# Patient Record
Sex: Male | Born: 1980 | Race: White | Hispanic: No | Marital: Married
Health system: Southern US, Community
[De-identification: ages and names within clinical notes are randomized; demographics above are authoritative.]

---

## 2013-12-02 ENCOUNTER — Emergency Department: Payer: Self-pay | Admitting: Emergency Medicine

## 2013-12-02 LAB — RAPID INFLUENZA A&B ANTIGENS

## 2014-08-25 ENCOUNTER — Ambulatory Visit: Payer: Self-pay | Admitting: Surgery

## 2014-08-27 LAB — PATHOLOGY REPORT

## 2015-01-27 IMAGING — CR DG CHEST 2V
1 series · 2 of 2 positions shown · non-contrast
Comparison: None.

CLINICAL DATA: 32-year-old male cough and fever. Initial encounter.

EXAM:
CHEST  2 VIEW

[Series 1: w chest pa · 0.14mm/px · 2 of 2 slices shown]
[im 1/2]
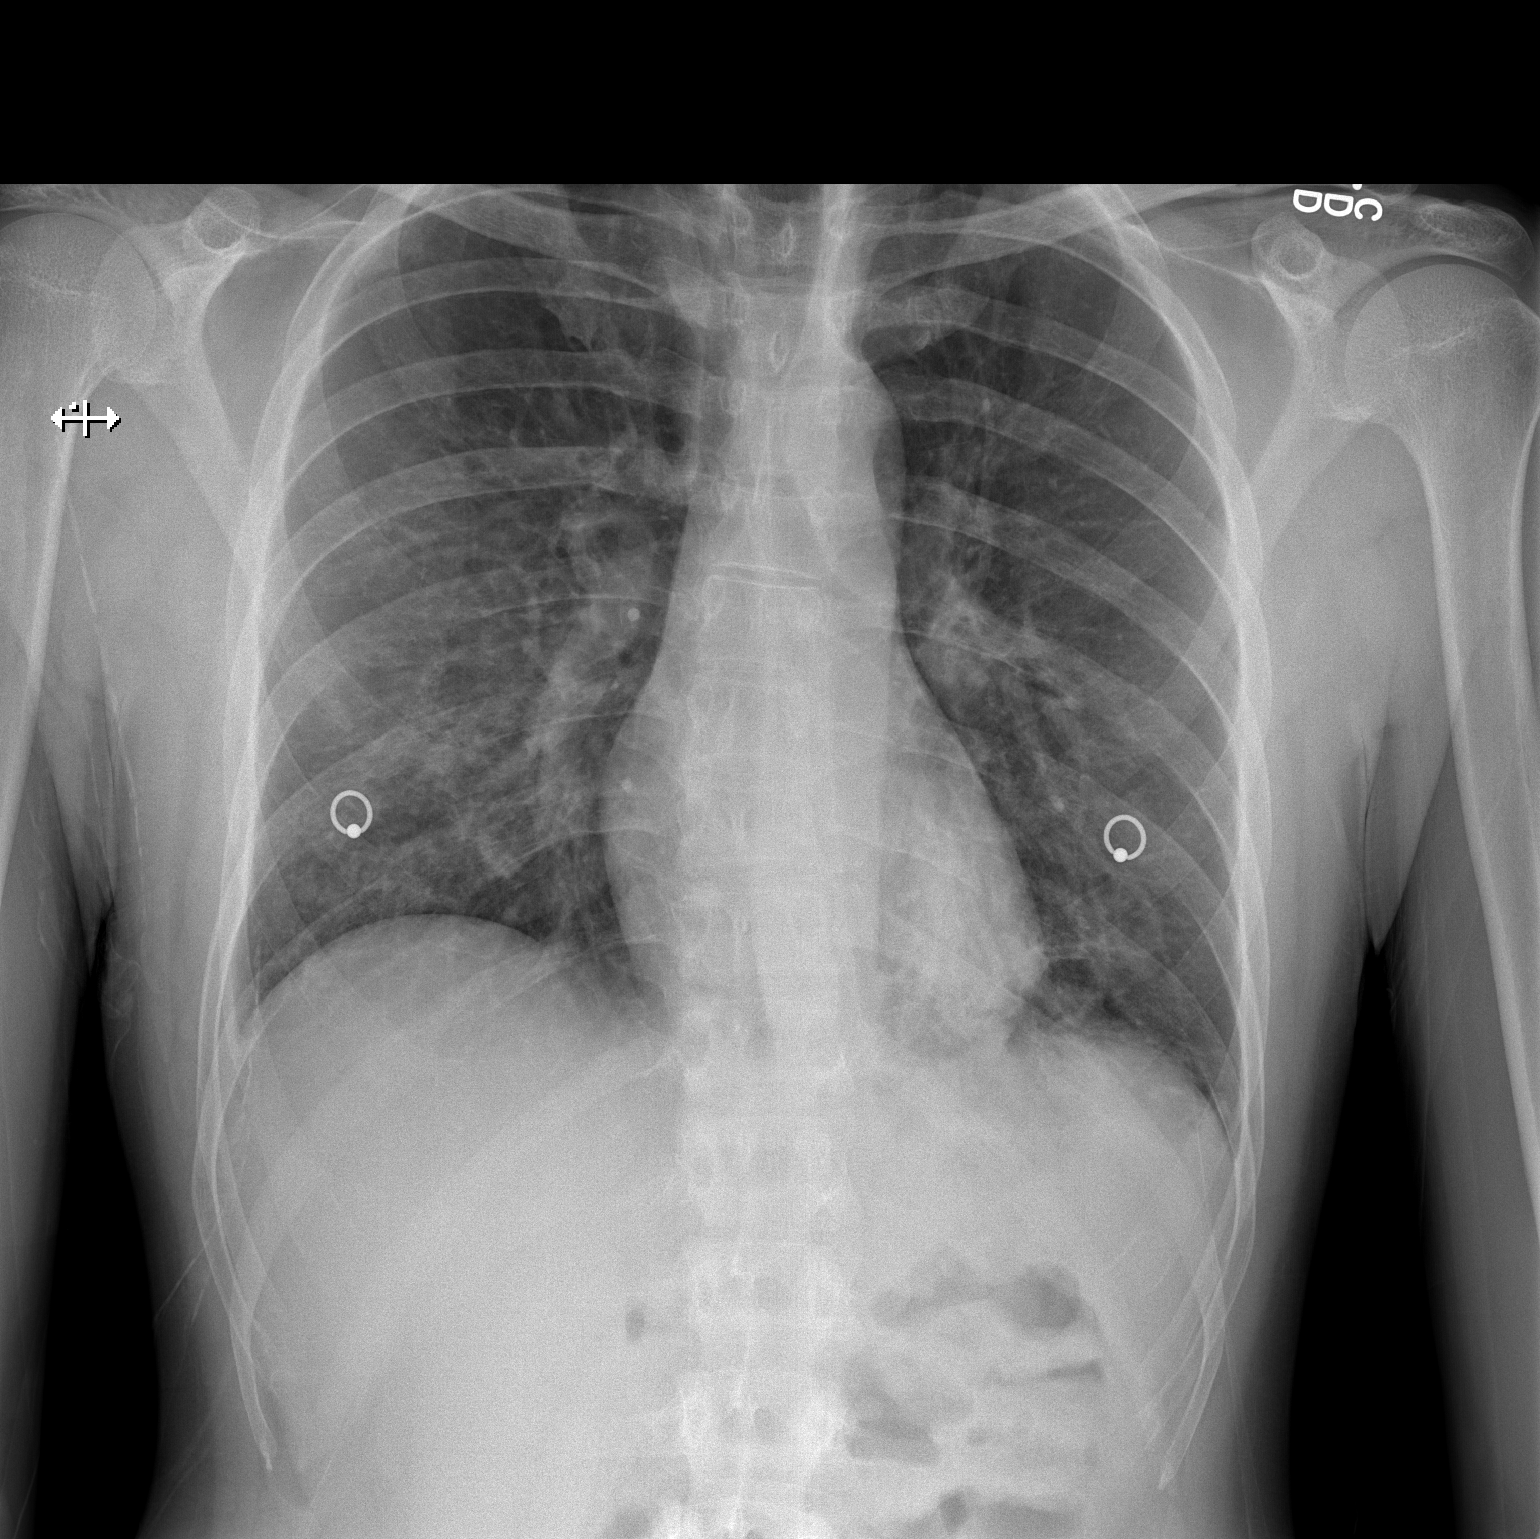
[im 2/2]
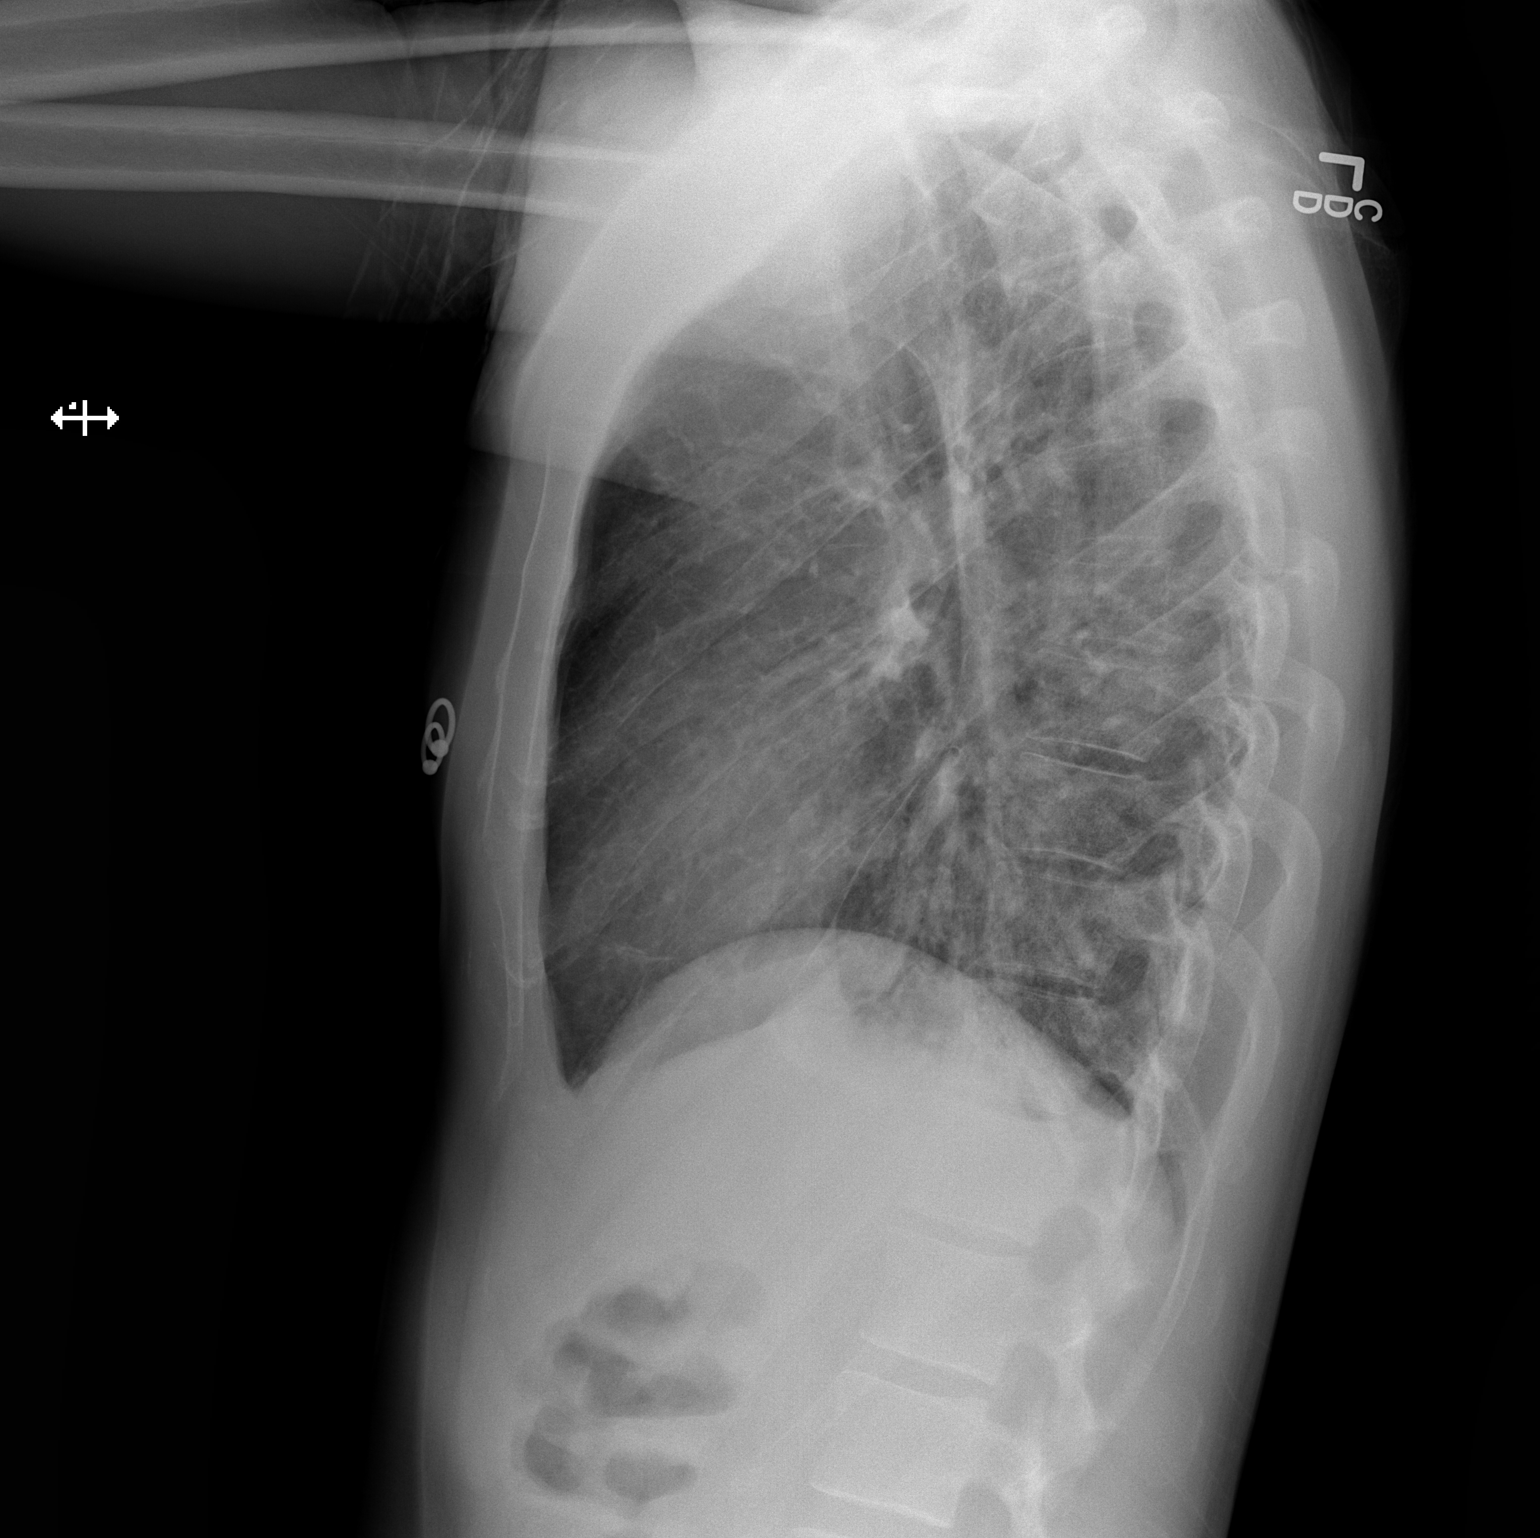

[2 of 2 positions shown; findings below may reference images not displayed]

FINDINGS: Mildly low lung volumes. Increased interstitial markings diffusely.
Streaky more confluent left lower lobe opacity. No pleural
effusions, pneumothorax or pulmonary edema. Normal cardiac size and
mediastinal contours. Visualized tracheal air column is within
normal limits. No acute osseous abnormality identified.
IMPRESSION: Bilateral increased interstitial and streaky left lower lobe
opacity, favor acute viral/ atypical pneumonia.

## 2015-02-06 ENCOUNTER — Emergency Department: Payer: Self-pay | Admitting: Emergency Medicine

## 2015-03-20 NOTE — Op Note (Signed)
PATIENT NAME:  Marc Nunez, Marc Nunez MR#:  [id number] DATE OF BIRTH:  10/29/1981  DATE OF PROCEDURE:  08/25/2014  PREOPERATIVE DIAGNOSIS: Pilonidal cyst and sinus tract with resolving abscess.   POSTOPERATIVE DIAGNOSIS:  Pilonidal cyst and sinus tract with resolving abscess.   OPERATION: Pilonidal cystectomy.    ANESTHESIA:  General.    SURGEON:  Tiney Rougealph Ely III, MD.    OPERATIVE PROCEDURE: With the patient under general anesthesia, he was placed in the appropriate prone position, padded and positioned. His buttocks were shaved and pulled apart with tape to better expose the buttock cleft. This area was prepped with Betadine and draped with sterile towels. Using a probe the site of the drainage was then probed. It did not go any further superior or cranial, but did go all the way down to the sinus tract noted inferiorly or caudally.  An elliptical incision was made around the identified sinus tract after injecting the sinus tract with methylene blue. The incision was carried down through the subcutaneous tissue by electrocautery. Care was taken to coagulate all bleeding sites. The sinus tract was dissected off of the presacral fascia without difficulty. At no point did we cross any methylene blue dye. The specimen was marked with margin markers and passed off to formalin.  The area was then irrigated. The defect was quite large. The inferior portion was closed with subcutaneous sutures of 0 Vicryl, skin was closed with vertical mattress sutures of 3-0 nylon. Bolster sutures of number 2 nylon were placed superiorly in order to take tension off the incision as there was significant tension on the skin edges. I elected to proceed with this type of repair as opposed to marsupialization. Penrose drain was placed through the depths of the incision and sutured in place with 4-0 nylon.  Sterile dressings were applied.  The patient was returned to the recovery room, having tolerated the procedure well.  Sponges and  needle count were correct x 2 in the operating room.      ____________________________ Quentin Orealph L. Ely III, MD rle:bu D: 08/25/2014 13:00:11 ET T: 08/25/2014 13:47:17 ET JOB#: [id number]  cc: Carmie Endalph L. Ely III, MD, <Dictator> Quentin OreALPH L ELY MD ELECTRONICALLY SIGNED 08/28/2014 17:51
